# Patient Record
Sex: Female | Born: 1995 | Race: White | Hispanic: No | Marital: Single | Smoking: Never smoker
Health system: Southern US, Community
[De-identification: ages and names within clinical notes are randomized; demographics above are authoritative.]

## PROBLEM LIST (undated history)

## (undated) DIAGNOSIS — Q249 Congenital malformation of heart, unspecified: Secondary | ICD-10-CM

## (undated) HISTORY — PX: HX WISDOM TEETH EXTRACTION: SHX21

## (undated) NOTE — Progress Notes (Signed)
Formatting of this note is different from the original.  Patient Name: Cassandra Marshall, Cassandra Marshall  [id number] Date: 07/23/2018    Preferred Name: Cassandra Marshall Date of Encounter: 07/23/2018   Patient MRN / CSN: [id number] / [id number] Encounter Provider: Arlie Solomons, MD   Patient Age / DOB: 61 year old / November 14, 1995       Subjective:     Patient ID: Cassandra Marshall is a 43 year old     Chief Complaint:    URI    This is a new problem. The current episode started in the past 7 days. The problem has been waxing and waning. There has been no fever. Associated symptoms include congestion (mild nasal), coughing, rhinorrhea and sinus pain. Pertinent negatives include no abdominal pain, chest pain, dysuria, ear pain, rash or vomiting. She has tried nothing for the symptoms.     No Known Allergies    Past Medical History:   Diagnosis Date   ? Hypertensive disorder      Past Surgical History:   Procedure Laterality Date   ? WISDOM TOOTH EXTRACTION       History reviewed. No pertinent family history.    Social History     Tobacco Use   ? Smoking status: Former Smoker   ? Smokeless tobacco: Never Used   Substance Use Topics   ? Alcohol use: No   ? Drug use: No     Outpatient Medications Prior to Visit   Medication Sig Dispense Refill   ? drospirenone-ethinyl estradiol (GIANVI) 3-0.02 MG per tablet Take 1 tablet by mouth daily.     ? Multiple Vitamins-Minerals (MULTIVITAMIN ADULT PO) Take by mouth.     ? ferrous sulfate 325 (65 FE) MG tablet Take 325 mg by mouth daily with breakfast.     ? hydrOXYzine (VISTARIL) 50 MG capsule Take 50 mg by mouth 3 (three) times a day as needed for itching.     ? Prenatal Vit-Fe Fumarate-FA (PRENATAL VITAMIN) 27-0.8 MG Tab Take 1 tablet by mouth daily.       No facility-administered medications prior to visit.      Review of Systems   Constitutional: Negative for fever.   HENT: Positive for congestion (mild nasal), rhinorrhea and sinus pain. Negative for ear pain.    Eyes: Negative for discharge.    Respiratory: Positive for cough. Negative for apnea, chest tightness, shortness of breath and stridor.    Cardiovascular: Negative for chest pain, palpitations and leg swelling.   Gastrointestinal: Negative for abdominal pain, blood in stool and vomiting.   Genitourinary: Negative for dysuria.   Skin: Negative for rash.   Neurological: Negative for dizziness.   Hematological: Negative for adenopathy.         Objective:     BP 138/84   Pulse 89   Temp 98.6 F (37 C)   Wt 79.8 kg (176 lb)   SpO2 99%   BMI 34.37 kg/m     Physical Exam   Constitutional: She is oriented to person, place, and time. She appears well-developed and well-nourished.   HENT:   Head: Normocephalic and atraumatic.   Right Ear: Hearing, tympanic membrane, external ear and ear canal normal.   Left Ear: Hearing, tympanic membrane, external ear and ear canal normal.   Nose: Rhinorrhea present.   Mouth/Throat: Uvula is midline and mucous membranes are normal. Posterior oropharyngeal erythema present.   Eyes: Pupils are equal, round, and reactive to light. Conjunctivae, EOM and lids are normal.   Neck: Trachea  normal and normal range of motion. Neck supple.   Cardiovascular: Normal rate, regular rhythm and normal heart sounds.   Pulmonary/Chest: Effort normal and breath sounds normal.   Lymphadenopathy:     She has no cervical adenopathy.   Neurological: She is alert and oriented to person, place, and time.   Skin: Skin is warm, dry and intact. No rash noted.   Psychiatric: She has a normal mood and affect. Her behavior is normal.   Vitals reviewed.    Assessment & Plan:     Results for orders placed or performed in visit on 07/23/18   POCT Rapid Strep A   Result Value Ref Range    Group A Strep Screen Negative Negative    Internal Control Acceptable Acceptable     Seri was seen today for nasal congestion.    Diagnoses and all orders for this visit:    Sore throat  -     POCT Rapid Strep A    URI, acute  -     triamcinolone (NASACORT) 55  MCG/ACT nasal inhaler; Administer 2 sprays into nostril(s) daily.    Symptoms are likely viral. Push fluids, rest. Diet as tolerated. Saline nasal spray, humidifier, tylenol and ibuprofen as needed for fever/comfort. If symptoms worsen or are not improving in the next several days, call or follow up for recheck. ER with SOB, intractable fever, vomiting, etc.     Arlie Solomons, MD  Electronically signed by Arlie Solomons, MD at 07/23/2018  7:39 PM EST

---

## 2014-09-27 ENCOUNTER — Emergency Department (HOSPITAL_COMMUNITY)
Admission: EM | Admit: 2014-09-27 | Discharge: 2014-09-27 | Disposition: A | Discharge status: Home / Self Care | Payer: Self-pay | Attending: Emergency Medicine | Admitting: Emergency Medicine

## 2014-09-27 ENCOUNTER — Encounter (HOSPITAL_COMMUNITY): Payer: Self-pay | Admitting: Emergency Medicine

## 2014-09-27 ENCOUNTER — Emergency Department (HOSPITAL_COMMUNITY): Payer: Self-pay

## 2014-09-27 DIAGNOSIS — R000 Tachycardia, unspecified: Secondary | ICD-10-CM | POA: Insufficient documentation

## 2014-09-27 DIAGNOSIS — R079 Chest pain, unspecified: Secondary | ICD-10-CM | POA: Insufficient documentation

## 2014-09-27 DIAGNOSIS — Q249 Congenital malformation of heart, unspecified: Secondary | ICD-10-CM | POA: Insufficient documentation

## 2014-09-27 DIAGNOSIS — R0789 Other chest pain: Secondary | ICD-10-CM

## 2014-09-27 HISTORY — DX: Congenital malformation of heart, unspecified: Q24.9

## 2014-09-27 LAB — CBC
HCT: 40.5 % (ref 36.0–46.0)
HEMOGLOBIN: 14.1 g/dL (ref 12.0–15.0)
MCH: 31.2 pg (ref 26.0–34.0)
MCHC: 34.8 g/dL (ref 30.0–36.0)
MCV: 89.6 fL (ref 78.0–100.0)
Platelets: 426 10*3/uL — ABNORMAL HIGH (ref 150–400)
RBC: 4.52 MIL/uL (ref 3.87–5.11)
RDW: 12.1 % (ref 11.5–15.5)
WBC: 8.8 10*3/uL (ref 4.0–10.5)

## 2014-09-27 LAB — BASIC METABOLIC PANEL
ANION GAP: 6 (ref 5–15)
BUN: 9 mg/dL (ref 6–23)
CO2: 31 mmol/L (ref 19–32)
CREATININE: 0.99 mg/dL (ref 0.50–1.10)
Calcium: 9.6 mg/dL (ref 8.4–10.5)
Chloride: 102 mEq/L (ref 96–112)
GFR calc Af Amer: >90 mL/min (ref >90)
GFR calc non Af Amer: 81 mL/min — ABNORMAL LOW (ref >90)
Glucose, Bld: 108 mg/dL — ABNORMAL HIGH (ref 70–99)
POTASSIUM: 3.8 mmol/L (ref 3.5–5.1)
Sodium: 139 mmol/L (ref 135–145)

## 2014-09-27 LAB — I-STAT TROPONIN, ED: TROPONIN I, POC: 0 ng/mL (ref 0.00–0.08)

## 2014-09-27 MED ORDER — HYDROCODONE-ACETAMINOPHEN 5-325 MG PO TABS
1.0000 | ORAL_TABLET | Freq: Once | ORAL | Status: AC
  Administered 2014-09-27: 1 via ORAL
  Filled 2014-09-27: qty 1

## 2014-09-27 MED ORDER — KETOROLAC TROMETHAMINE 60 MG/2ML IM SOLN: 60.0000 mg | Freq: Once | INTRAMUSCULAR | Status: DC

## 2014-09-27 MED ORDER — LORAZEPAM 1 MG PO TABS
1.0000 mg | ORAL_TABLET | Freq: Once | ORAL | Status: AC
  Administered 2014-09-27: 1 mg via ORAL
  Filled 2014-09-27: qty 1

## 2014-09-27 NOTE — ED Notes (Signed)
Pt. reports intermittent left chest pain onset this evening , no SOB , denies nausea or diaphoresis . Denies chest pain at triage .

## 2014-09-27 NOTE — Discharge Instructions (Signed)

## 2014-09-27 NOTE — ED Provider Notes (Signed)
CSN: [id number]     Arrival date & time 09/27/14  2028 History   First MD Initiated Contact with Patient 09/27/14 2115     Chief Complaint  Patient presents with  . 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 Patient states pain is located in her left chest and went under her left breast and into her left shoulder. She also reports having tingling in her fingertips. She denies any shortness of breath with this. She does report having anxiety. Pain was rated 10/10. While waiting in triage reports pain has improved to a 5/10 on its own without treatment. She denies any diaphoresis, nausea/vomiting. No recent illness. Patient was seen by cardiology in the past after her pediatrician found her to be hypertensive and was at that time they found the "hole in her valve." patient states she was not instructed to follow-up with cardiology following this. She states she recently moved here from [place] and has not established a primary care doctor. Past Medical History  Diagnosis Date  . Congenital malformation of heart    History reviewed. No pertinent past surgical history. No family history on file. History  Substance Use Topics  . Smoking status: Never Smoker   . Smokeless tobacco: Not on file  . Alcohol Use: No   OB History    No data available     Review of Systems  Constitutional: Negative for fever and chills.  HENT: Negative for congestion, rhinorrhea and sore throat.   Eyes: Negative for visual disturbance.  Respiratory: Negative for cough and shortness of breath.   Cardiovascular: Positive for chest pain. Negative for palpitations and leg swelling.  Gastrointestinal: Negative for nausea, vomiting,  abdominal pain, diarrhea and constipation.  Genitourinary: Negative for dysuria, hematuria, vaginal bleeding and vaginal discharge.  Musculoskeletal: Negative for back pain and neck pain.  Skin: Negative for rash.  Neurological: Positive for numbness. Negative for weakness and headaches.  All other systems reviewed and are negative.     Allergies  Review of patient's allergies indicates no known allergies.  Home Medications   Prior to Admission medications   Not on File   BP 132/97 mmHg  Pulse 98  Temp(Src) 98.3 F (36.8 C) (Oral)  Resp 16  Ht 5\' 1"  (1.549 m)  Wt 170 lb (77.111 kg)  BMI 32.14 kg/m2  SpO2 100%  LMP 09/20/2014 Physical Exam  Constitutional: She is oriented to person, place, and time. She appears well-developed and well-nourished. No distress.  HENT:  Head: Normocephalic and atraumatic.  Eyes: Conjunctivae are normal.  Neck: Normal range of motion.  Cardiovascular: Regular rhythm, normal heart sounds and intact distal pulses.  Tachycardia present.   No murmur heard. Pulmonary/Chest: Effort normal and breath sounds normal. No respiratory distress. She has no wheezes. She has no rales. She exhibits no tenderness.  Abdominal: Soft. Bowel sounds are normal. She exhibits no distension.  Musculoskeletal: Normal range of motion.  Neurological: She is alert and oriented to person, place, and time. No cranial nerve deficit.  Skin: Skin is warm and dry.  Psychiatric: She has a normal mood and affect.  Nursing note and vitals reviewed.   ED Course  Procedures (including critical care time) Labs Review Labs Reviewed  CBC - Abnormal; Notable for the following:  Platelets 426 (*)    All other components within normal limits  BASIC METABOLIC PANEL - Abnormal; Notable for the following:    Glucose, Bld 108 (*)    GFR calc non Af Amer 81 (*)    All other components within normal limits  I-STAT TROPOININ, ED    Imaging Review Dg Chest 2 View  09/27/2014    CLINICAL DATA:  Left-sided chest pain for 1 day  EXAM: CHEST  2 VIEW  COMPARISON:  None.  FINDINGS: The heart size and mediastinal contours are within normal limits. Both lungs are clear. The visualized skeletal structures are unremarkable.  IMPRESSION: No active cardiopulmonary disease.   Electronically Signed   By: Elige Ko   On: 09/27/2014 21:04     EKG Interpretation None      MDM   Final diagnoses:  Chest pain    Meghan York is a 19 y.o. female with H&P as above. HTS, NAD in ED. Presents for chest pain. Benign exam and no murmur appreciated on exam. Equal pulses bilaterally. No lower extremity edema. EKG shows flat ST segment in III, aVF. Labs, Chest x-ray unremarkable. Discussed case with cardiology via telephone only and they will set patient up for outpatient cardiology follow-up. Patient himself or discharge.  Clinical Impression: 1. Chest pain of uncertain etiology   2. Chest pain     Disposition: Discharge  Condition: Good  I have discussed the results, Dx and Tx plan with the pt(& family if present). He/she/they expressed understanding and agree(s) with the plan. Discharge instructions discussed at great length. Strict return precautions discussed and pt &/or family have verbalized understanding of the instructions. No further questions at time of discharge.    New Prescriptions   No medications on file    Follow Up: Ardis Rowan, MD [address] [place] [address] [phone]   You will receive a call in next 24-48 hours regarding time/place of upcoming cardiology appointment  [facility] EMERGENCY DEPARTMENT [address] [id number] mc [place] [address] [phone]  If symptoms worsen   Pt seen in conjunction with Dr. Gerhard Munch, MD  Ames Dura, DO [facility] Emergency Medicine Resident - PGY-2      Ames Dura, MD 09/27/14 4132  Gerhard Munch, MD 09/27/14 [phone]

## 2014-10-20 ENCOUNTER — Encounter: Payer: Self-pay | Admitting: Cardiology

## 2014-11-11 ENCOUNTER — Encounter: Payer: Self-pay | Admitting: Cardiology

## 2014-11-26 ENCOUNTER — Encounter (HOSPITAL_COMMUNITY): Payer: Self-pay | Admitting: Emergency Medicine

## 2014-11-26 ENCOUNTER — Emergency Department (HOSPITAL_COMMUNITY)
Admission: EM | Admit: 2014-11-26 | Discharge: 2014-11-26 | Discharge status: Against Medical Advice | Payer: Self-pay | Attending: Emergency Medicine | Admitting: Emergency Medicine

## 2014-11-26 DIAGNOSIS — Y929 Unspecified place or not applicable: Secondary | ICD-10-CM | POA: Insufficient documentation

## 2014-11-26 DIAGNOSIS — Y999 Unspecified external cause status: Secondary | ICD-10-CM | POA: Insufficient documentation

## 2014-11-26 DIAGNOSIS — S70361A Insect bite (nonvenomous), right thigh, initial encounter: Secondary | ICD-10-CM | POA: Insufficient documentation

## 2014-11-26 DIAGNOSIS — Q249 Congenital malformation of heart, unspecified: Secondary | ICD-10-CM | POA: Insufficient documentation

## 2014-11-26 DIAGNOSIS — Y939 Activity, unspecified: Secondary | ICD-10-CM | POA: Insufficient documentation

## 2014-11-26 DIAGNOSIS — W57XXXA Bitten or stung by nonvenomous insect and other nonvenomous arthropods, initial encounter: Secondary | ICD-10-CM | POA: Insufficient documentation

## 2014-11-26 NOTE — ED Notes (Signed)
Pt didn't want to wait any longer.

## 2014-11-26 NOTE — ED Notes (Signed)
Pt st's she was bitten by ? Insect on Wed.  Pt has small reddened area to right lat thigh.  Pt st's painful to touch

## 2014-12-11 ENCOUNTER — Emergency Department (INDEPENDENT_AMBULATORY_CARE_PROVIDER_SITE_OTHER)
Admission: EM | Admit: 2014-12-11 | Discharge: 2014-12-11 | Disposition: A | Discharge status: Home / Self Care | Payer: Self-pay | Source: Home / Self Care | Attending: Family Medicine | Admitting: Family Medicine

## 2014-12-11 ENCOUNTER — Encounter (HOSPITAL_COMMUNITY): Payer: Self-pay | Admitting: Emergency Medicine

## 2014-12-11 DIAGNOSIS — R110 Nausea: Secondary | ICD-10-CM

## 2014-12-11 LAB — POCT URINALYSIS DIP (DEVICE)
Bilirubin Urine: NEGATIVE
GLUCOSE, UA: NEGATIVE mg/dL
Hgb urine dipstick: NEGATIVE
Ketones, ur: NEGATIVE mg/dL
Leukocytes, UA: NEGATIVE
Nitrite: NEGATIVE
Protein, ur: NEGATIVE mg/dL
SPECIFIC GRAVITY, URINE: 1.025 (ref 1.005–1.030)
UROBILINOGEN UA: 0.2 mg/dL (ref 0.0–1.0)
pH: 6 (ref 5.0–8.0)

## 2014-12-11 LAB — POCT PREGNANCY, URINE: PREG TEST UR: NEGATIVE

## 2014-12-11 MED ORDER — PROMETHAZINE HCL 25 MG PO TABS: 25.0000 mg | ORAL_TABLET | Freq: Four times a day (QID) | ORAL | Status: AC | PRN

## 2014-12-11 MED ORDER — OMEPRAZOLE 40 MG PO CPDR: 40.0000 mg | DELAYED_RELEASE_CAPSULE | Freq: Every day | ORAL | Status: AC

## 2014-12-11 NOTE — ED Provider Notes (Signed)
Meghan York is a 19 y.o. female who presents to Urgent Care today for nausea for last 4 days associated with some vomiting. Nausea occurs off and on and tends to improve with food. No chest pain palpitation or abdominal pain. She's not tried any medications yet. Her last menstrual period was March 7. She does not use any form of contraception and does have sex regularly.   Past Medical History  Diagnosis Date  . Congenital malformation of heart    History reviewed. No pertinent past surgical history. History  Substance Use Topics  . Smoking status: Never Smoker   . Smokeless tobacco: Not on file  . Alcohol Use: No   ROS as above Medications: No current facility-administered medications for this encounter.   Current Outpatient Prescriptions  Medication Sig Dispense Refill  . omeprazole (PRILOSEC) 40 MG capsule Take 1 capsule (40 mg total) by mouth daily. 30 capsule 1  . promethazine (PHENERGAN) 25 MG tablet Take 1 tablet (25 mg total) by mouth every 6 (six) hours as needed for nausea or vomiting. 30 tablet 0   No Known Allergies   Exam:  BP 117/79 mmHg  Pulse 93  Temp(Src) 98.6 F (37 C) (Oral)  Resp 18  SpO2 100%  LMP 11/15/2014 Gen: Well NAD HEENT: EOMI,  MMM Lungs: Normal work of breathing. CTABL Heart: RRR no MRG Abd: NABS, Soft. Nondistended, Nontender Exts: Brisk capillary refill, warm and well perfused.   Results for orders placed or performed during the hospital encounter of 12/11/14 (from the past 24 hour(s))  POCT urinalysis dip (device)     Status: None   Collection Time: 12/11/14 10:10 AM  Result Value Ref Range   Glucose, UA NEGATIVE NEGATIVE mg/dL   Bilirubin Urine NEGATIVE NEGATIVE   Ketones, ur NEGATIVE NEGATIVE mg/dL   Specific Gravity, Urine 1.025 1.005 - 1.030   Hgb urine dipstick NEGATIVE NEGATIVE   pH 6.0 5.0 - 8.0   Protein, ur NEGATIVE NEGATIVE mg/dL   Urobilinogen, UA 0.2 0.0 - 1.0 mg/dL   Nitrite NEGATIVE NEGATIVE   Leukocytes, UA  NEGATIVE NEGATIVE  Pregnancy, urine POC     Status: None   Collection Time: 12/11/14 10:15 AM  Result Value Ref Range   Preg Test, Ur NEGATIVE NEGATIVE   No results found.  Assessment and Plan: 19 y.o. female with nausea. Unclear etiology. Patient may be very early stages of pregnancy. Recommend patient recheck pregnancy test and off week or 2. Treat nausea with Phenergan and omeprazole. Recommend patient follow[facility] with [facility] for starting birth control.  Discussed warning signs or symptoms. Please see discharge instructions. Patient expresses understanding.     Rodolph BongEvan S Arla Boutwell, MD 12/11/14 1044

## 2014-12-11 NOTE — ED Notes (Signed)
Reports she has been feeling nauseas, fatigue, LH, back pain and has now began to vomit onset Tuesday Denies fevers chills; reports she had 1/4 preg test at home positive LMP = 11/15/14 Alert, no signs of acute distress.

## 2014-12-11 NOTE — Discharge Instructions (Signed)
Thank you for coming in today. If your belly pain worsens, or you have high fever, bad vomiting, blood in your stool or black tarry stool go to the Emergency Room.  Take another pregnancy test in 1-2 weeks.  [facility]   [facility] [address], [place], [place] [address] [phone]   Nausea, Adult Nausea is the feeling that you have an upset stomach or have to vomit. Nausea by itself is not likely a serious concern, but it may be an early sign of more serious medical problems. As nausea gets worse, it can lead to vomiting. If vomiting develops, there is the risk of dehydration.  CAUSES   Viral infections.  Food poisoning.  Medicines.  Pregnancy.  Motion sickness.  Migraine headaches.  Emotional distress.  Severe pain from any source.  Alcohol intoxication. HOME CARE INSTRUCTIONS  Get plenty of rest.  Ask your caregiver about specific rehydration instructions.  Eat small amounts of food and sip liquids more often.  Take all medicines as told by your caregiver. SEEK MEDICAL CARE IF:  You have not improved after 2 days, or you get worse.  You have a headache. SEEK IMMEDIATE MEDICAL CARE IF:   You have a fever.  You faint.  You keep vomiting or have blood in your vomit.  You are extremely weak or dehydrated.  You have dark or bloody stools.  You have severe chest or abdominal pain. MAKE SURE YOU:  Understand these instructions.  Will watch your condition.  Will get help right away if you are not doing well or get worse. Document Released: 10/04/2004 Document Revised: 05/21/2012 Document Reviewed: 05/09/2011 [facility] Patient Information 2015 [place] [place]. This information is not intended to replace advice given to you by your health care provider. Make sure you discuss any questions you have with your health care provider.

## 2015-05-26 IMAGING — DX DG CHEST 2V
2 series · 2 of 2 positions shown · non-contrast
Comparison: None.

CLINICAL DATA: Left-sided chest pain for 1 day

EXAM:
CHEST  2 VIEW

[chest pa]
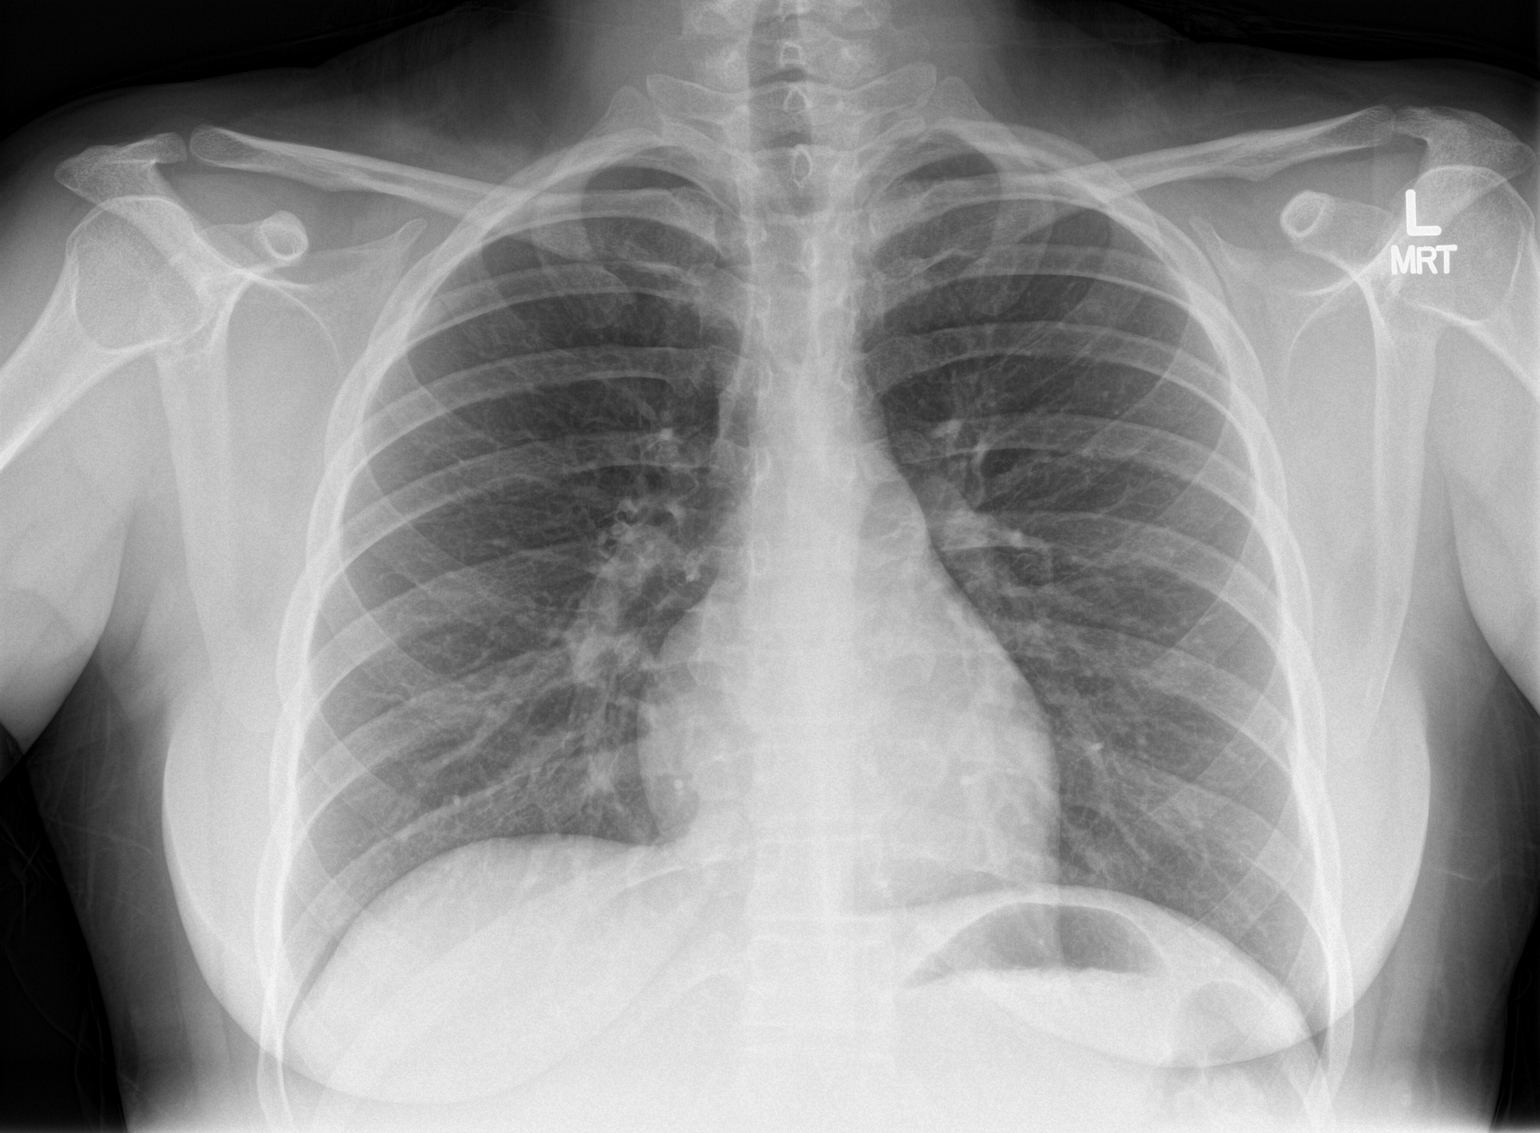

[chest lat]
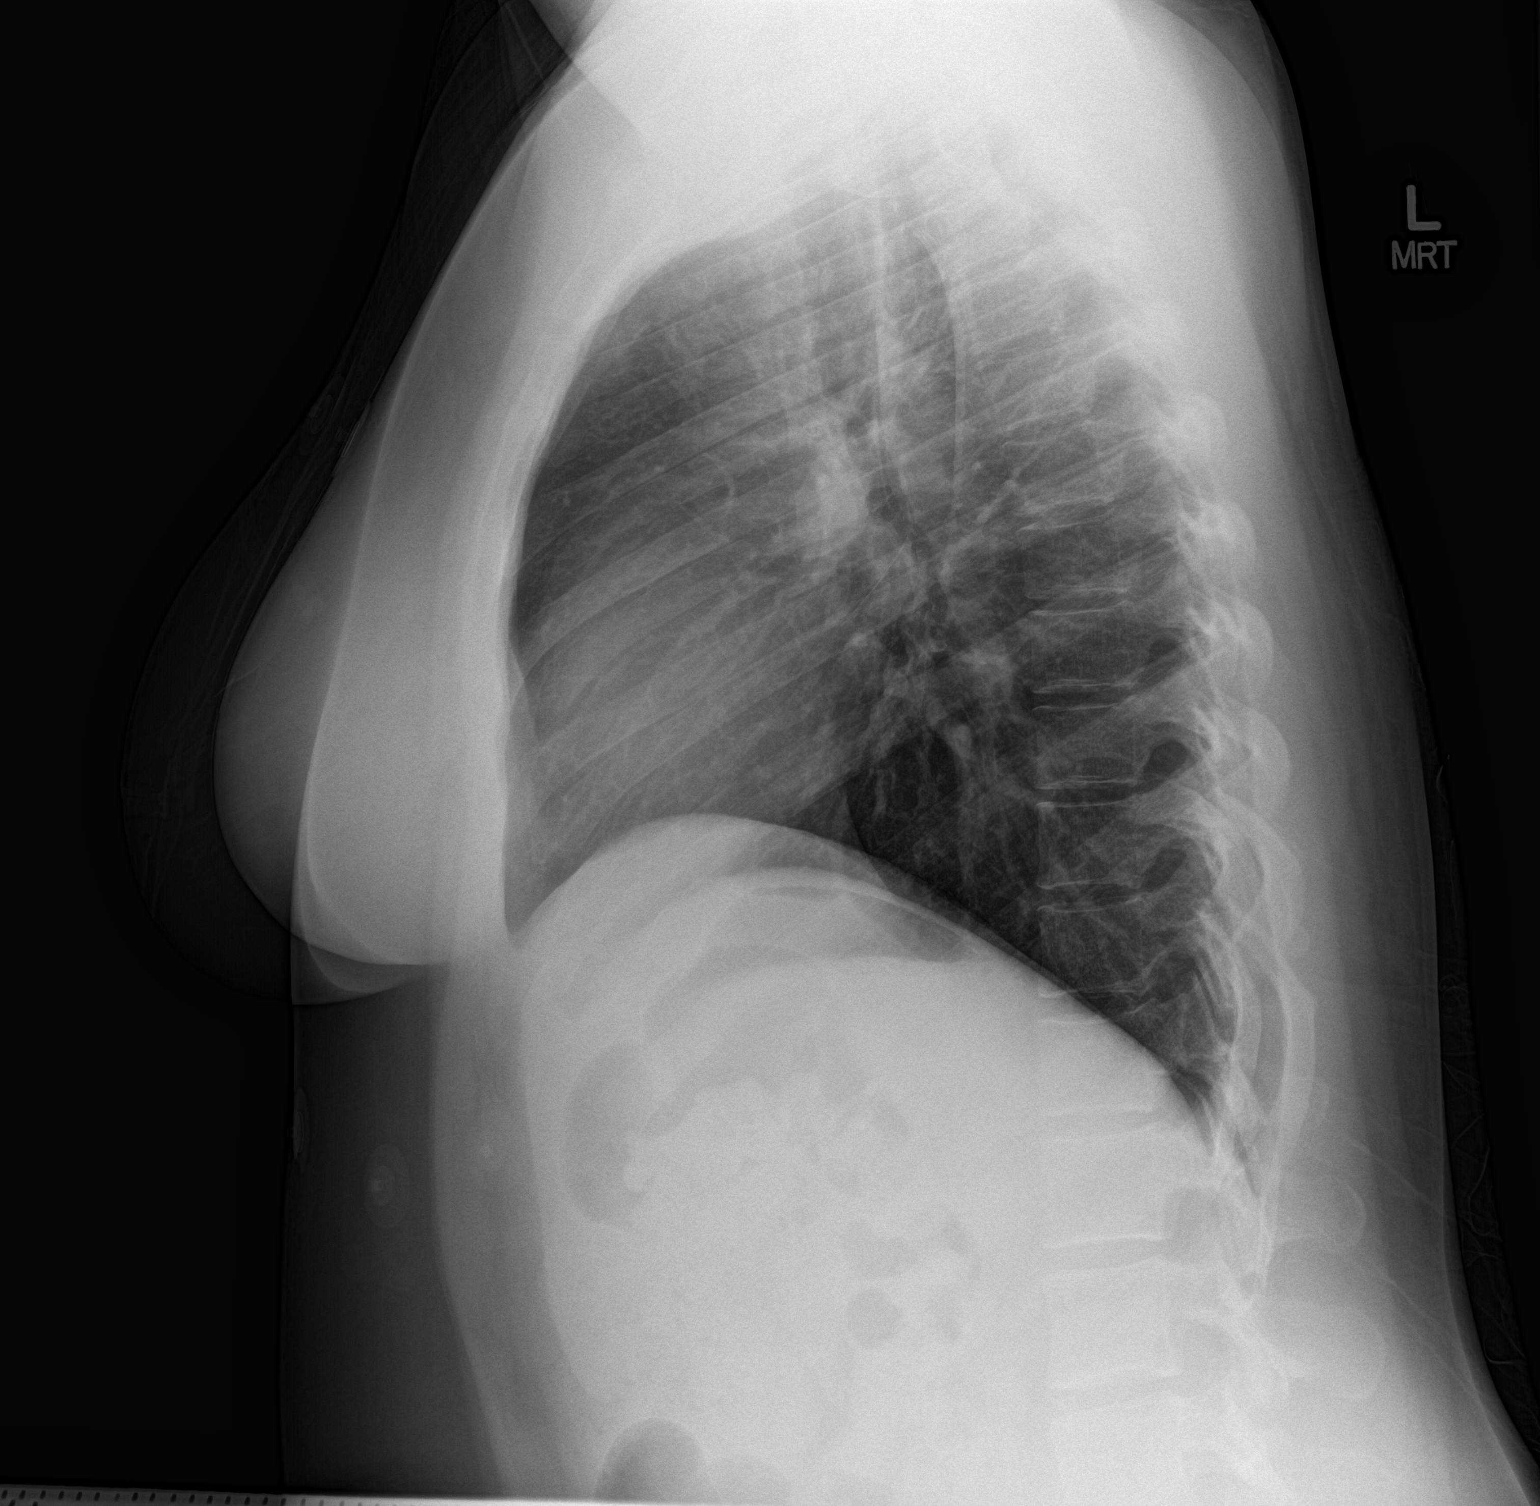

[2 of 2 positions shown; findings below may reference images not displayed]

FINDINGS: The heart size and mediastinal contours are within normal limits.
Both lungs are clear. The visualized skeletal structures are
unremarkable.
IMPRESSION: No active cardiopulmonary disease.

## 2022-10-02 ENCOUNTER — Ambulatory Visit
Admission: RE | Admit: 2022-10-02 | Discharge: 2022-10-02 | Disposition: A | Discharge status: Home / Self Care | Payer: Medicaid Other | Source: Ambulatory Visit | Attending: NURSE PRACTITIONER | Admitting: NURSE PRACTITIONER

## 2022-10-02 ENCOUNTER — Other Ambulatory Visit (HOSPITAL_COMMUNITY): Payer: Self-pay | Admitting: NURSE PRACTITIONER

## 2022-10-02 DIAGNOSIS — Z0001 Encounter for general adult medical examination with abnormal findings: Secondary | ICD-10-CM

## 2022-10-02 DIAGNOSIS — Z114 Encounter for screening for human immunodeficiency virus [HIV]: Secondary | ICD-10-CM | POA: Insufficient documentation

## 2022-10-02 DIAGNOSIS — Z1159 Encounter for screening for other viral diseases: Secondary | ICD-10-CM

## 2022-10-02 LAB — CBC/DIFF - CLIENT CONSOLIDATED
BASOPHIL #: <0.1 10*3/uL (ref <=0.20)
BASOPHIL %: 1 %
EOSINOPHIL #: <0.1 10*3/uL (ref <=0.50)
EOSINOPHIL %: 1 %
HCT: 39.1 % (ref 34.8–46.0)
HGB: 13 g/dL (ref 11.5–16.0)
IMMATURE GRANULOCYTE #: <0.1 10*3/uL (ref <0.10)
IMMATURE GRANULOCYTE %: 0 % (ref 0.0–1.0)
LYMPHOCYTE #: 2.72 10*3/uL (ref 1.00–4.80)
LYMPHOCYTE %: 37 %
MCH: 30.8 pg (ref 26.0–32.0)
MCHC: 33.2 g/dL (ref 31.0–35.5)
MCV: 92.7 fL (ref 78.0–100.0)
MONOCYTE #: 0.44 10*3/uL (ref 0.20–1.10)
MONOCYTE %: 6 %
MPV: 10.5 fL (ref 8.7–12.5)
NEUTROPHIL #: 4.13 10*3/uL (ref 1.50–7.70)
NEUTROPHIL %: 55 %
PLATELETS: 383 10*3/uL (ref 150–400)
RBC: 4.22 10*6/uL (ref 3.85–5.22)
RDW-CV: 12.2 % (ref 11.5–15.5)
WBC: 7.4 10*3/uL (ref 3.7–11.0)

## 2022-10-02 LAB — CBC WITH DIFF
BASOPHIL #: <0.1 10*3/uL (ref <=0.20)
BASOPHIL %: 1 %
EOSINOPHIL #: <0.1 10*3/uL (ref <=0.50)
EOSINOPHIL %: 1 %
HCT: 39.1 % (ref 34.8–46.0)
HGB: 13 g/dL (ref 11.5–16.0)
IMMATURE GRANULOCYTE #: <0.1 10*3/uL (ref <0.10)
IMMATURE GRANULOCYTE %: 0 % (ref 0.0–1.0)
LYMPHOCYTE #: 2.72 10*3/uL (ref 1.00–4.80)
LYMPHOCYTE %: 37 %
MCH: 30.8 pg (ref 26.0–32.0)
MCHC: 33.2 g/dL (ref 31.0–35.5)
MCV: 92.7 fL (ref 78.0–100.0)
MONOCYTE #: 0.44 10*3/uL (ref 0.20–1.10)
MONOCYTE %: 6 %
MPV: 10.5 fL (ref 8.7–12.5)
NEUTROPHIL #: 4.13 10*3/uL (ref 1.50–7.70)
NEUTROPHIL %: 55 %
PLATELETS: 383 10*3/uL (ref 150–400)
RBC: 4.22 10*6/uL (ref 3.85–5.22)
RDW-CV: 12.2 % (ref 11.5–15.5)
WBC: 7.4 10*3/uL (ref 3.7–11.0)

## 2022-10-02 LAB — COMPREHENSIVE METABOLIC PANEL, NON-FASTING
ALBUMIN: 3.5 g/dL (ref 3.5–5.0)
ALKALINE PHOSPHATASE: 80 U/L (ref 40–110)
ALT (SGPT): 37 U/L — ABNORMAL HIGH (ref 8–22)
ANION GAP: 9 mmol/L (ref 4–13)
AST (SGOT): 18 U/L (ref 8–45)
BILIRUBIN TOTAL: 0.6 mg/dL (ref 0.3–1.3)
BUN/CREA RATIO: 8 (ref 6–22)
BUN: 7 mg/dL — ABNORMAL LOW (ref 8–25)
CALCIUM: 8.7 mg/dL (ref 8.6–10.2)
CHLORIDE: 107 mmol/L (ref 96–111)
CO2 TOTAL: 24 mmol/L (ref 22–30)
CREATININE: 0.87 mg/dL (ref 0.60–1.05)
ESTIMATED GFR - FEMALE: >90 mL/min/BSA (ref >=60)
GLUCOSE: 98 mg/dL (ref 65–125)
POTASSIUM: 4 mmol/L (ref 3.5–5.1)
PROTEIN TOTAL: 7 g/dL (ref 6.4–8.3)
SODIUM: 140 mmol/L (ref 136–145)

## 2022-10-02 LAB — HEPATITIS C ANTIBODY SCREEN WITH REFLEX TO HCV PCR: HCV ANTIBODY QUALITATIVE: NEGATIVE

## 2022-10-02 LAB — HIV1/HIV2 SCREEN, COMBINED ANTIGEN AND ANTIBODY: HIV SCREEN, COMBINED ANTIGEN & ANTIBODY: NEGATIVE

## 2022-10-04 ENCOUNTER — Other Ambulatory Visit (HOSPITAL_COMMUNITY): Payer: Self-pay | Admitting: NURSE PRACTITIONER

## 2022-10-04 ENCOUNTER — Other Ambulatory Visit (HOSPITAL_COMMUNITY)
Admit: 2022-10-04 | Discharge: 2022-10-04 | Disposition: A | Discharge status: Home / Self Care | Payer: Self-pay | Attending: NURSE PRACTITIONER | Admitting: NURSE PRACTITIONER

## 2022-10-04 DIAGNOSIS — Z029 Encounter for administrative examinations, unspecified: Secondary | ICD-10-CM

## 2022-10-08 ENCOUNTER — Other Ambulatory Visit (HOSPITAL_COMMUNITY): Payer: Self-pay

## 2022-10-08 ENCOUNTER — Ambulatory Visit
Admission: RE | Admit: 2022-10-08 | Discharge: 2022-10-08 | Disposition: A | Discharge status: Home / Self Care | Payer: Medicaid Other | Source: Ambulatory Visit

## 2022-10-08 DIAGNOSIS — Z0000 Encounter for general adult medical examination without abnormal findings: Secondary | ICD-10-CM

## 2022-10-08 LAB — HEPATITIS B SURFACE ANTIBODY: HBV SURFACE ANTIBODY QUANTITATIVE: 13 m[IU]/mL — ABNORMAL HIGH (ref <8)

## 2022-10-10 LAB — RUBELLA VIRUS ANTIBODIES, IGG, SERUM: RUBELLA IGG QUALITATIVE: POSITIVE

## 2022-10-10 LAB — MUMPS VIRUS ANTIBODY, IGG, SERUM: MUMPS IGG QUALITATIVE: UNDETERMINED — AB

## 2022-10-10 LAB — MEASLES IGG: MEASLES IGG QUALITATIVE: POSITIVE

## 2022-10-10 LAB — VARICELLA-ZOSTER ANTIBODY, IGG, SERUM: VZV IGG QUALITATIVE: POSITIVE

## 2022-10-30 ENCOUNTER — Other Ambulatory Visit (HOSPITAL_COMMUNITY): Payer: Self-pay | Admitting: NURSE PRACTITIONER

## 2022-10-30 DIAGNOSIS — Z114 Encounter for screening for human immunodeficiency virus [HIV]: Secondary | ICD-10-CM

## 2022-10-30 DIAGNOSIS — Z113 Encounter for screening for infections with a predominantly sexual mode of transmission: Secondary | ICD-10-CM

## 2022-10-31 ENCOUNTER — Other Ambulatory Visit
Admission: RE | Admit: 2022-10-31 | Discharge: 2022-10-31 | Disposition: A | Discharge status: Home / Self Care | Payer: Medicaid Other | Source: Ambulatory Visit | Attending: NURSE PRACTITIONER | Admitting: NURSE PRACTITIONER

## 2022-10-31 DIAGNOSIS — Z114 Encounter for screening for human immunodeficiency virus [HIV]: Secondary | ICD-10-CM | POA: Insufficient documentation

## 2022-10-31 DIAGNOSIS — Z113 Encounter for screening for infections with a predominantly sexual mode of transmission: Secondary | ICD-10-CM | POA: Insufficient documentation

## 2022-10-31 LAB — HEPATITIS B SURFACE ANTIGEN: HBV SURFACE ANTIGEN QUALITATIVE: NEGATIVE

## 2022-10-31 LAB — HEPATITIS C ANTIBODY SCREEN WITH REFLEX TO HCV PCR: HCV ANTIBODY QUALITATIVE: NEGATIVE

## 2022-10-31 LAB — HEPATITIS B SURFACE ANTIBODY: HBV SURFACE ANTIBODY QUANTITATIVE: 13 m[IU]/mL — ABNORMAL HIGH (ref <8)

## 2022-10-31 LAB — HEPATITIS B CORE ANTIBODY: HBV CORE TOTAL ANTIBODIES: NEGATIVE

## 2022-10-31 LAB — SYPHILIS SCREENING ALGORITHM WITH REFLEX, SERUM: SYPHILIS TP ANTIBODIES: NONREACTIVE

## 2022-10-31 LAB — HIV1/HIV2 SCREEN, COMBINED ANTIGEN AND ANTIBODY: HIV SCREEN, COMBINED ANTIGEN & ANTIBODY: NEGATIVE

## 2022-11-06 LAB — HERPES SIMPLEX VIRUS (HSV) TYPE 1- AND TYPE 2-SPECIFIC ABS, IGG
HSV-1 IGG QUALITATIVE: NEGATIVE
HSV2 IGG QUALITATIVE: POSITIVE — AB

## 2023-01-08 ENCOUNTER — Emergency Department (HOSPITAL_COMMUNITY): Payer: Medicaid Other

## 2023-01-08 ENCOUNTER — Emergency Department (HOSPITAL_COMMUNITY): Payer: Medicaid Other | Admitting: Radiology

## 2023-01-08 ENCOUNTER — Encounter (HOSPITAL_COMMUNITY): Payer: Self-pay

## 2023-01-08 ENCOUNTER — Emergency Department
Admission: EM | Admit: 2023-01-08 | Discharge: 2023-01-08 | Disposition: A | Discharge status: Home / Self Care | Payer: Medicaid Other | Attending: Emergency Medicine | Admitting: Emergency Medicine

## 2023-01-08 ENCOUNTER — Other Ambulatory Visit: Payer: Self-pay

## 2023-01-08 DIAGNOSIS — S93401A Sprain of unspecified ligament of right ankle, initial encounter: Secondary | ICD-10-CM | POA: Insufficient documentation

## 2023-01-08 DIAGNOSIS — X501XXA Overexertion from prolonged static or awkward postures, initial encounter: Secondary | ICD-10-CM | POA: Insufficient documentation

## 2023-01-08 DIAGNOSIS — S93409A Sprain of unspecified ligament of unspecified ankle, initial encounter: Secondary | ICD-10-CM

## 2023-01-08 MED ORDER — ACETAMINOPHEN 500 MG TABLET
1000.0000 mg | ORAL_TABLET | ORAL | Status: AC
Start: 2023-01-08 — End: 2023-01-08
  Administered 2023-01-08: 1000 mg via ORAL
  Filled 2023-01-08: qty 2

## 2023-01-08 MED ORDER — IBUPROFEN 800 MG TABLET
800.0000 mg | ORAL_TABLET | ORAL | Status: AC
Start: 2023-01-08 — End: 2023-01-08
  Administered 2023-01-08: 800 mg via ORAL
  Filled 2023-01-08: qty 1

## 2023-01-08 NOTE — ED Nurses Note (Signed)
Patient discharged home with family.  AVS reviewed with patient/care giver.  A written copy of the AVS and discharge instructions was given to the patient/care giver.  Patient provided with crutches and educated on use. Questions sufficiently answered as needed.  Patient/care giver encouraged to follow up with PCP as indicated.  Patient instructed to follow up with Podiatry. In the event of an emergency, patient/care giver instructed to call 911 or go to the nearest emergency room.

## 2023-01-08 NOTE — ED Triage Notes (Signed)
Pt presents to ED with c/o rt ankle injury.  Pt sts was walking down stairs when she went to step and rolled ankle causing her to fall.

## 2023-01-08 NOTE — ED Provider Notes (Addendum)
[facility] - Emergency Department  ED Primary Provider Note    Arrival: The patient arrived by private vehicle and is accompanied by friend    HPI:   History obtained from:  Patient  A 27 y.o.  female presenting to the ED for evaluation of ankle pain.  Patient has stepped off a porch stair and rolled her right ankle.  She did fall.  No other injuries reported.  Pains of diffuse pain in the ankle.  No upper lateral leg pain.  Unable to bear weight secondary to pain.        Medications:  Prior to Admission Medications   Prescriptions Last Dose Informant Patient Reported? Taking?   SUMATRIPTAN SUCCINATE ORAL   Yes Yes   Sig: Take by mouth Once per day as needed   ethinyl estradiol/drospirenone (DROSPIRENONE-ETHINYL ESTRADIOL ORAL)   Yes Yes   Sig: Take by mouth   sertraline (ZOLOFT) 50 mg Oral Tablet   Yes Yes   Sig: Take 1 Tablet (50 mg total) by mouth Once a day      Facility-Administered Medications: None       Allergies:  No Known Allergies    Past Medical History:  History reviewed. No pertinent past medical history.        Past Surgical History:          Social History:  Social History     Socioeconomic History    Marital status: Single     Spouse name: Not on file    Number of children: Not on file    Years of education: Not on file    Highest education level: Not on file   Occupational History    Not on file   Tobacco Use    Smoking status: Never     Passive exposure: Never    Smokeless tobacco: Never   Vaping Use    Vaping status: Every Day   Substance and Sexual Activity    Alcohol use: Not Currently     Comment: occasionally    Drug use: Never    Sexual activity: Not on file   Other Topics Concern    Not on file   Social History Narrative    Not on file     Social Determinants of Health     Financial Resource Strain: Not on file   Transportation Needs: Not on file   Social Connections: Not on file   Intimate Partner Violence: Not on file   Housing Stability: Not on file       Family  History:  Family Medical History:    None             I have reviewed the nursing records and agree as documented. I have reviewed the medication list.    Review of Systems:       Filed Vitals:    01/08/23 2058   BP: 129/78   Pulse: (!) 110   Resp: 20   Temp: 36.7 C (98 F)   SpO2: 100%       Physical Exam:      Constitutional: Patient is awake, alert and in mild-to-moderate discomfort  Musculoskeletal:  Decreased plantar and dorsiflexion secondary to pain.  Mild swelling over the lateral malleolus.  Moderate tenderness to palpation over this area.  Distal pulses intact.       Labs:  No results found for this or any previous visit (from the past 24 hour(s)).    Imaging:  Results for  orders placed or performed during the hospital encounter of 01/08/23   XR ANKLE RIGHT     Status: None    Narrative    Elanore Boze    PROCEDURE DESCRIPTION: XR ANKLE RIGHT    TECHNIQUE: 3 views / 3 images submitted.    CLINICAL INDICATION: Twisted right ankle    COMPARISON: No prior studies were compared.      FINDINGS: Lateral soft tissue swelling. No acute fracture or dislocation. Ankle mortise is preserved.      Impression    Lateral soft tissue swelling without acute fracture identified.                Radiologist location ID: [id number]         Orders Placed This Encounter    XR ANKLE RIGHT    FOLLOW-UP: PODIATRY - [facility] - [place], [place]    DME - CRUTCHES         Medical Decision Making  Amount and/or Complexity of Data Reviewed  Radiology: ordered.               X-ray reveals lateral soft tissue swelling without evidence of fracture.  Will place in an Ace wrap and supply with crutches.  Advised to ice and elevate the extremity as well as take Tylenol and ibuprofen around the clock for pain.  Will have her follow-up with podiatry as soon as possible.  A referral will be placed in the computer.    Impression:   Clinical Impression   Ankle sprain (Primary)     Disposition:  Discharged        Nancie Neas, MD  01/08/2023, 21:32

## 2023-01-14 ENCOUNTER — Other Ambulatory Visit: Payer: Self-pay

## 2023-01-14 ENCOUNTER — Encounter (INDEPENDENT_AMBULATORY_CARE_PROVIDER_SITE_OTHER): Payer: Self-pay | Admitting: Foot & Ankle Surgery

## 2023-01-14 ENCOUNTER — Ambulatory Visit: Payer: Medicaid Other | Attending: Emergency Medicine | Admitting: Podiatrist

## 2023-01-14 VITALS — HR 96 | Temp 97.3°F | Resp 18

## 2023-01-14 DIAGNOSIS — S99911A Unspecified injury of right ankle, initial encounter: Secondary | ICD-10-CM

## 2023-01-14 DIAGNOSIS — S93401A Sprain of unspecified ligament of right ankle, initial encounter: Secondary | ICD-10-CM | POA: Insufficient documentation

## 2023-01-14 DIAGNOSIS — M25571 Pain in right ankle and joints of right foot: Secondary | ICD-10-CM | POA: Insufficient documentation

## 2023-01-14 MED ORDER — METHYLPREDNISOLONE 4 MG TABLETS IN A DOSE PACK
ORAL_TABLET | ORAL | 0 refills | Status: AC
Start: 2023-01-14 — End: ?

## 2023-01-14 NOTE — Progress Notes (Signed)
CC: Ankle sprain right    HPI:  This 27 y.o. female with PMH indicated below presents complaining of a right ankle sprain. Patient states that she twisted her ankle when stepping down from a stair on 01/08/2023. She presented to the ED and was told that she had a sprain. They wrapped her ankle but she was not given an ankle brace. She admits to taking Tylenol for pain management. She states that her pain is primarily to the back of the ankle, but there is some pain to the bottom of the ankle. Patient states that she has to put pressure to the top of her foot when ambulating to avoid pain to the ankle. 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 Denies any other pedal complaints.    History reviewed. No pertinent past medical history.      Current Outpatient Medications   Medication Sig    ethinyl estradiol/drospirenone (DROSPIRENONE-ETHINYL ESTRADIOL ORAL) Take by mouth    sertraline (ZOLOFT) 50 mg Oral Tablet Take 1 Tablet (50 mg total) by mouth Once a day    SUMATRIPTAN SUCCINATE ORAL Take by mouth Once per day as needed     No Known Allergies  Past Surgical History:   Procedure Laterality Date    HX WISDOM TEETH EXTRACTION       Family Medical History:       Problem Relation (Age of Onset)    Bladder Cancer Paternal Grandfather    Breast Cancer Maternal Grandmother, Paternal Grandmother    Diabetes Maternal Grandmother, Maternal Grandfather, Paternal Grandmother, Paternal Grandfather    Hypertension (High Blood Pressure) Maternal Grandmother, Maternal Grandfather, Paternal Grandmother, Paternal Grandfather          Social History     Socioeconomic History    Marital status: Single   Tobacco Use    Smoking status: Never     Passive exposure: Never    Smokeless tobacco: Never   Vaping Use    Vaping status: Every Day   Substance and Sexual Activity    Alcohol use: Not Currently     Comment: occasionally    Drug use: Never        REVIEW OF SYSTEMS  MSK: + as noted in HPI.    Physical Examination:   On General Observation: Patient is a pleasant, cooperative, well developed 27 y.o.  adult female. The patient is alert and oriented to time, place and person. Patient has normal affect and mood.    Vascular:  DP and PT pulses are palpable.  CFT less than 3 seconds to all digits bilateral.  Skin temperature is warm to warm from proximal to distal bilateral.  Hair growth is noted. Edema and ecchymosis noted to the lateral ankle right.  No varicosities noted.      Neuro:  Light touch intact bilateral. Protective sensation intact at all pedal sites via Semmes Weinstein 5.07 monofilament bilateral.  Proprioception intact at the hallux bilateral.  No clonus noted.  Babinski reflex not elicited bilaterally.    Derm:  Skin texture and turgor within normal limits.  Toenails normal in appearance.  Webspaces 1-4 clean, dry, intact b/l.  No rashes, subcutaneous nodules, or open lesions noted.  Positive ecchymosis. There are no fracture blisters.  No cellulitis, no lymphangitis, no SSI. No hyperkeratotic tissue.     Musculoskeletal/Orthopaedic:   The ankle is stable to testing with negative anterior  drawer right foot. Negative talar tilt. Ecchymosis is present.  Edema is present over the lateral malleolus.  Palpation to the medial ankle is painful.  No high fibular pain is noted.  No pain to the syndesmosis or other areas of the ankle with external rotation of the foot at the ankle joint.  Pain to palpation of the lateral ankle is noted at the ATFL ligament.  There is no bone pain noted to the distal fibula.  The peroneals are intact and are nontender.  No pain to the fifth metatarsal base.  No pain to the medial aspect of the midfoot or navicular tuberosity.  No pain to palpation anterior calcaneal process. Muscle strength testing is deferred secondary to injury. Pain is noted with palpation of the medial deltoid ligament.    Radiographs: 3 views of the  right ankle were obtained on 01/08/2023. Radiographs were reviewed and discussed with the patient to their understanding.      FINDINGS: Lateral soft tissue swelling. No acute fracture or dislocation. Ankle mortise is preserved.     IMPRESSION:  Lateral soft tissue swelling without acute fracture identified.      Assessment:  This is a 27 y.o. patient presenting with ankle sprain right foot    Jalaila was seen today for ankle injury.    Diagnoses and all orders for this visit:    Moderate right ankle sprain, initial encounter    Injury of right ankle, initial encounter    Right ankle pain    Other orders  -     FOLLOW-UP: PODIATRY - [facility] [place], [place]  -     Methylprednisolone (MEDROL DOSEPACK) 4 mg Oral Tablets, Dose Pack; Take as instructed.      Plan:   A comprehensive history and physical examination were preformed. The patient was educated on clinical and radiographic findings, diagnosis and treatment plans. Patient state that she understands all that has been explained and all questions were answered to her apparent satisfaction.     - Patient was examined and evaluated.   - Patient was educated on etiology of ankle sprain and anticipated course of treatment.   - Lace up ankle brace was fit and dispensed in office today.   - Dispensed Tubigrip compressions with instructions of proper usage.   - Instructed the patient to begin elevating her ankle.    - A prescription for a Medrol dose pack was written and sent to the patient's pharmacy. They are to take as directed on the package. Do not take any additional antiinflammatory medication while taking.    - Follow up in 3 weeks.    I am scribing for, and in the prescence of, Dr. Casimiro Needle for services provided on 01/14/2023  Algis Liming, SCRIBE    I personally performed the services described in this documentation, as scribed  in my presence, and it is both accurate  and complete.    Melina Schools, DPM

## 2023-02-11 ENCOUNTER — Ambulatory Visit (INDEPENDENT_AMBULATORY_CARE_PROVIDER_SITE_OTHER): Payer: Self-pay | Admitting: Podiatrist

## 2023-02-13 ENCOUNTER — Encounter (INDEPENDENT_AMBULATORY_CARE_PROVIDER_SITE_OTHER): Payer: Self-pay | Admitting: Podiatrist

## 2023-02-13 ENCOUNTER — Other Ambulatory Visit: Payer: Self-pay

## 2023-02-13 ENCOUNTER — Ambulatory Visit: Payer: Medicaid Other | Attending: Podiatrist | Admitting: Podiatrist

## 2023-02-13 VITALS — HR 88 | Temp 97.4°F | Resp 19

## 2023-02-13 DIAGNOSIS — S93401D Sprain of unspecified ligament of right ankle, subsequent encounter: Secondary | ICD-10-CM | POA: Insufficient documentation

## 2023-02-13 DIAGNOSIS — S99911D Unspecified injury of right ankle, subsequent encounter: Secondary | ICD-10-CM | POA: Insufficient documentation

## 2023-02-13 DIAGNOSIS — M25571 Pain in right ankle and joints of right foot: Secondary | ICD-10-CM | POA: Insufficient documentation

## 2023-02-13 DIAGNOSIS — S93401A Sprain of unspecified ligament of right ankle, initial encounter: Secondary | ICD-10-CM | POA: Insufficient documentation

## 2023-02-13 NOTE — Nursing Note (Signed)
Patient presents today for follow up right ankle injury. Patient states her pain is 3/10 right now. Lianne Bushy, CNA

## 2023-02-13 NOTE — Progress Notes (Signed)
CC: Ankle sprain right    HPI:  This 27 y.o. female with PMH indicated below presents for follow up right ankle sprain. Rates pain as 3/10. She states that her pain has improved, but it is still present. She endorses continued swelling. She is wearing and doing well with her lace-up ankle brace and Tubigrip compressions. She completed her Medrol dose pack and tolerated it well. She has noticed increased pain when standing after resting after an 8 hour shift. Denies any other pedal complaints.    History reviewed. No pertinent past medical history.      Current Outpatient Medications   Medication Sig    ethinyl estradiol/drospirenone (DROSPIRENONE-ETHINYL ESTRADIOL ORAL) Take by mouth    Methylprednisolone (MEDROL DOSEPACK) 4 mg Oral Tablets, Dose Pack Take as instructed.    sertraline (ZOLOFT) 50 mg Oral Tablet Take 1 Tablet (50 mg total) by mouth Once a day    SUMATRIPTAN SUCCINATE ORAL Take by mouth Once per day as needed     No Known Allergies  Past Surgical History:   Procedure Laterality Date    HX WISDOM TEETH EXTRACTION       Family Medical History:       Problem Relation (Age of Onset)    Bladder Cancer Paternal Grandfather    Breast Cancer Maternal Grandmother, Paternal Grandmother    Diabetes Maternal Grandmother, Maternal Grandfather, Paternal Grandmother, Paternal Grandfather    Hypertension (High Blood Pressure) Maternal Grandmother, Maternal Grandfather, Paternal Grandmother, Paternal Grandfather          Social History     Socioeconomic History    Marital status: Single   Tobacco Use    Smoking status: Never     Passive exposure: Never    Smokeless tobacco: Never   Vaping Use    Vaping status: Every Day   Substance and Sexual Activity    Alcohol use: Not Currently     Comment: occasionally    Drug use: Never       Physical Examination:   On General Observation: Patient is a pleasant, cooperative, well developed 27 y.o.  adult female. The patient is alert and oriented to time, place and person. Patient  has normal affect and mood.    Vitals:    02/13/23 1425   Pulse: 88   Resp: 19   Temp: 36.3 C (97.4 F)   SpO2: 97%     Vascular:  DP and PT pulses are palpable.  CFT less than 3 seconds to all digits bilateral.  Skin temperature is warm to warm from proximal to distal bilateral.  Hair growth is noted. Decreased edema noted to the lateral ankle right.  No varicosities noted.      Neuro:  Light touch intact bilateral. Protective sensation intact at all pedal sites via Semmes Weinstein 5.07 monofilament bilateral.  Proprioception intact at the hallux bilateral.  No clonus noted.  Babinski reflex not elicited bilaterally.    Derm:  Skin texture and turgor within normal limits.  Toenails normal in appearance.  Webspaces 1-4 clean, dry, intact b/l.  No rashes, subcutaneous nodules, or open lesions noted.  No ecchymosis. There are no fracture blisters.  No cellulitis, no lymphangitis, no SSI. No hyperkeratotic tissue.     Musculoskeletal/Orthopaedic:   The ankle is stable to testing with negative anterior drawer right foot. Negative talar tilt. Ecchymosis is absent.  Edema is decreased over the lateral malleolus. Palpation to the medial ankle is not painful.  No high fibular pain is noted. No  pain to the syndesmosis or other areas of the ankle with external rotation of the foot at the ankle joint. No pain to palpation of the lateral ankle is noted at the ATFL ligament.  There is no bone pain noted to the distal fibula. The peroneals are intact and are nontender. No pain to the fifth metatarsal base.  No pain to the medial aspect of the midfoot or navicular tuberosity.  No pain to palpation anterior calcaneal process. Muscle strength testing is deferred secondary to injury. Pain is noted with palpation of the medial deltoid ligament. Mild pain with palpation is noted over the posterior tibial ligament. Improved strength noted.     Radiographs: 3 views of the right ankle were obtained on 01/08/2023. Radiographs were  reviewed and discussed with the patient to their understanding.      FINDINGS: Lateral soft tissue swelling. No acute fracture or dislocation. Ankle mortise is preserved.     IMPRESSION:  Lateral soft tissue swelling without acute fracture identified.    Assessment:  This is a 27 y.o. patient presenting with ankle sprain right foot    Gwendloyn was seen today for ankle injury.    Diagnoses and all orders for this visit:    Moderate right ankle sprain, subsequent encounter    Injury of right ankle, subsequent encounter    Acute right ankle pain      Plan:   A comprehensive history and physical examination were preformed. The patient was educated on clinical and radiographic findings, diagnosis and treatment plans. Patient state that she understands all that has been explained and all questions were answered to her apparent satisfaction.     - Patient was examined and evaluated.   - Etiology and treatment options were discussed.  - Continue in lace-up ankle brace with Tubigrip compressions as instructed. Dispensed additional compressions.   - Recommend performing home strengthening and stretching exercises; patient was shown techniques and dispensed written instructions with a Theraband.   - Follow up in 3 weeks.    I am scribing for, and in the prescence of, Dr. Casimiro Needle for services provided on 02/13/2023  Algis Liming, SCRIBE    I personally performed the services described in this documentation, as scribed  in my presence, and it is both accurate  and complete.    Melina Schools, DPM

## 2023-03-04 ENCOUNTER — Ambulatory Visit (INDEPENDENT_AMBULATORY_CARE_PROVIDER_SITE_OTHER): Payer: Self-pay | Admitting: Podiatrist

## 2023-05-16 ENCOUNTER — Ambulatory Visit (HOSPITAL_COMMUNITY): Payer: Self-pay

## 2023-05-16 ENCOUNTER — Other Ambulatory Visit: Payer: Self-pay

## 2023-05-16 ENCOUNTER — Other Ambulatory Visit: Payer: No Typology Code available for payment source

## 2023-05-16 DIAGNOSIS — Z20828 Contact with and (suspected) exposure to other viral communicable diseases: Secondary | ICD-10-CM

## 2023-05-16 DIAGNOSIS — Z20822 Contact with and (suspected) exposure to covid-19: Secondary | ICD-10-CM | POA: Insufficient documentation

## 2023-05-16 LAB — COVID-19 ~~LOC~~ MOLECULAR LAB TESTING: SARS-CoV-2: NOT DETECTED

## 2023-05-16 NOTE — Telephone Encounter (Signed)
COVID-19 SCREENING NOTE    Primary Care Provider: [facility]     Has the patient had recent travel to/from or exposure to someone with recent travel to a COVID-19 "hot spot"? No travel, no exposure  Has the patient had recent exposure to a known case of COVID-19: No  Has the patient had fever? No  Has the patient had cough? Yes  Has the patient had shortness of breath? No  Additional symptoms? no additional symptoms reported     There is no problem list on file for this patient.        Occupation: Data Unavailable    Disposition:   SCREENING INDICATED.  Order to be placed. SCREENING SITE: [facility].  Patient phone number and address confirmed: [phone], [address]  [place] [address].         Verbal consent obtained from the patient to be contacted with result.    Linus Orn, LPN

## 2023-10-07 ENCOUNTER — Ambulatory Visit (HOSPITAL_COMMUNITY): Payer: Self-pay

## 2023-10-07 ENCOUNTER — Other Ambulatory Visit: Payer: Self-pay

## 2023-10-07 ENCOUNTER — Ambulatory Visit: Payer: No Typology Code available for payment source

## 2023-10-07 DIAGNOSIS — Z20828 Contact with and (suspected) exposure to other viral communicable diseases: Secondary | ICD-10-CM | POA: Insufficient documentation

## 2023-10-07 LAB — COVID-19 ~~LOC~~ MOLECULAR LAB TESTING
INFLUENZA VIRUS TYPE A: DETECTED — AB
INFLUENZA VIRUS TYPE B: NOT DETECTED
RESPIRATORY SYNCTIAL VIRUS (RSV): NOT DETECTED
SARS-CoV-2: NOT DETECTED

## 2023-10-07 NOTE — Telephone Encounter (Signed)
COVID-19 SCREENING NOTE    Primary Care Provider: [facility]     Has the patient had recent travel to/from or exposure to someone with recent travel to a COVID-19 "hot spot"? No travel, no exposure  Has the patient had recent exposure to a known case of COVID-19: No  Has the patient had fever? Yes  Has the patient had cough? No  Has the patient had shortness of breath? No  Additional symptoms? +chills, +headache     There is no problem list on file for this patient.        Occupation: Data Unavailable    Disposition:   SCREENING INDICATED.  Order to be placed. SCREENING SITE: [facility].  Patient phone number and address confirmed: [phone], [address]  [place] [address].         Verbal consent obtained from the patient to be contacted with result.    Verlee Monte, RN

## 2024-04-06 ENCOUNTER — Other Ambulatory Visit (HOSPITAL_COMMUNITY): Payer: Self-pay | Admitting: NURSE PRACTITIONER

## 2024-04-06 ENCOUNTER — Other Ambulatory Visit (HOSPITAL_COMMUNITY)
Admission: RE | Admit: 2024-04-06 | Discharge: 2024-04-06 | Disposition: A | Discharge status: Home / Self Care | Source: Ambulatory Visit | Attending: NURSE PRACTITIONER | Admitting: NURSE PRACTITIONER

## 2024-04-06 ENCOUNTER — Ambulatory Visit: Attending: NURSE PRACTITIONER | Admitting: NURSE PRACTITIONER

## 2024-04-06 DIAGNOSIS — N390 Urinary tract infection, site not specified: Secondary | ICD-10-CM

## 2024-04-09 LAB — URINE CULTURE: URINE CULTURE: NO GROWTH

## 2024-04-24 ENCOUNTER — Other Ambulatory Visit (HOSPITAL_COMMUNITY): Payer: Self-pay | Admitting: NURSE PRACTITIONER

## 2024-04-24 ENCOUNTER — Ambulatory Visit
Admission: RE | Admit: 2024-04-24 | Discharge: 2024-04-24 | Disposition: A | Discharge status: Home / Self Care | Source: Ambulatory Visit | Attending: NURSE PRACTITIONER | Admitting: NURSE PRACTITIONER

## 2024-04-24 DIAGNOSIS — Z0001 Encounter for general adult medical examination with abnormal findings: Secondary | ICD-10-CM

## 2024-04-24 LAB — CBC/DIFF - CLIENT CONSOLIDATED
BASOPHIL #: <0.1 x10ˆ3/uL (ref <=0.20)
BASOPHIL %: 0.9 %
EOSINOPHIL #: 0.11 x10ˆ3/uL (ref <=0.50)
EOSINOPHIL %: 2 %
HCT: 40.1 % (ref 34.8–46.0)
HGB: 13.3 g/dL (ref 11.5–16.0)
IMMATURE GRANULOCYTE #: <0.1 x10ˆ3/uL (ref <0.10)
IMMATURE GRANULOCYTE %: 0.2 % (ref 0.0–1.0)
LYMPHOCYTE #: 2.03 x10ˆ3/uL (ref 1.00–4.80)
LYMPHOCYTE %: 37.7 %
MCH: 30.6 pg (ref 26.0–32.0)
MCHC: 33.2 g/dL (ref 31.0–35.5)
MCV: 92.4 fL (ref 78.0–100.0)
MONOCYTE #: 0.36 x10ˆ3/uL (ref 0.20–1.10)
MONOCYTE %: 6.7 %
MPV: 10 fL (ref 8.7–12.5)
NEUTROPHIL #: 2.82 x10ˆ3/uL (ref 1.50–7.70)
NEUTROPHIL %: 52.5 %
PLATELETS: 399 x10ˆ3/uL (ref 150–400)
RBC: 4.34 x10ˆ6/uL (ref 3.85–5.22)
RDW-CV: 11.8 % (ref 11.5–15.5)
WBC: 5.4 x10ˆ3/uL (ref 3.7–11.0)

## 2024-04-24 LAB — COMPREHENSIVE METABOLIC PANEL, NON-FASTING
ALBUMIN: 3.7 g/dL (ref 3.5–5.0)
ALKALINE PHOSPHATASE: 69 U/L (ref 40–110)
ALT (SGPT): 55 U/L — ABNORMAL HIGH (ref <31)
ANION GAP: 11 mmol/L (ref 4–13)
AST (SGOT): 21 U/L (ref 11–34)
BILIRUBIN TOTAL: 0.6 mg/dL (ref 0.3–1.3)
BUN/CREA RATIO: 12 (ref 6–22)
BUN: 9 mg/dL (ref 8–25)
CALCIUM: 9.2 mg/dL (ref 8.6–10.2)
CHLORIDE: 107 mmol/L (ref 96–111)
CO2 TOTAL: 22 mmol/L (ref 22–30)
CREATININE: 0.76 mg/dL (ref 0.60–1.05)
GLUCOSE: 72 mg/dL (ref 65–125)
POTASSIUM: 4.1 mmol/L (ref 3.5–5.1)
PROTEIN TOTAL: 6.8 g/dL (ref 6.0–7.9)
SODIUM: 140 mmol/L (ref 136–145)
eGFRcr - FEMALE: >90 mL/min/BSA (ref >=60)

## 2024-04-24 LAB — CBC WITH DIFF
BASOPHIL #: <0.1 x10ˆ3/uL (ref <=0.20)
BASOPHIL %: 0.9 %
EOSINOPHIL #: 0.11 x10ˆ3/uL (ref <=0.50)
EOSINOPHIL %: 2 %
HCT: 40.1 % (ref 34.8–46.0)
HGB: 13.3 g/dL (ref 11.5–16.0)
IMMATURE GRANULOCYTE #: <0.1 x10ˆ3/uL (ref <0.10)
IMMATURE GRANULOCYTE %: 0.2 % (ref 0.0–1.0)
LYMPHOCYTE #: 2.03 x10ˆ3/uL (ref 1.00–4.80)
LYMPHOCYTE %: 37.7 %
MCH: 30.6 pg (ref 26.0–32.0)
MCHC: 33.2 g/dL (ref 31.0–35.5)
MCV: 92.4 fL (ref 78.0–100.0)
MONOCYTE #: 0.36 x10ˆ3/uL (ref 0.20–1.10)
MONOCYTE %: 6.7 %
MPV: 10 fL (ref 8.7–12.5)
NEUTROPHIL #: 2.82 x10ˆ3/uL (ref 1.50–7.70)
NEUTROPHIL %: 52.5 %
PLATELETS: 399 x10ˆ3/uL (ref 150–400)
RBC: 4.34 x10ˆ6/uL (ref 3.85–5.22)
RDW-CV: 11.8 % (ref 11.5–15.5)
WBC: 5.4 x10ˆ3/uL (ref 3.7–11.0)
